# Patient Record
Sex: Male | Born: 1965 | Race: White | Hispanic: No | Marital: Married | Smoking: Never smoker
Health system: Southern US, Community
[De-identification: ages and names within clinical notes are randomized; demographics above are authoritative.]

## PROBLEM LIST (undated history)

## (undated) DIAGNOSIS — C801 Malignant (primary) neoplasm, unspecified: Secondary | ICD-10-CM

## (undated) HISTORY — PX: KIDNEY DONATION: SHX685

## (undated) HISTORY — PX: HERNIA REPAIR: SHX51

---

## 2015-08-29 ENCOUNTER — Ambulatory Visit (INDEPENDENT_AMBULATORY_CARE_PROVIDER_SITE_OTHER): Payer: Self-pay

## 2015-08-29 ENCOUNTER — Other Ambulatory Visit: Payer: Self-pay | Admitting: Adult Health

## 2015-08-29 DIAGNOSIS — M438X6 Other specified deforming dorsopathies, lumbar region: Secondary | ICD-10-CM

## 2015-08-29 DIAGNOSIS — R52 Pain, unspecified: Secondary | ICD-10-CM

## 2015-08-29 DIAGNOSIS — M545 Low back pain: Secondary | ICD-10-CM

## 2015-08-29 DIAGNOSIS — G8929 Other chronic pain: Secondary | ICD-10-CM

## 2016-01-21 ENCOUNTER — Emergency Department (INDEPENDENT_AMBULATORY_CARE_PROVIDER_SITE_OTHER)
Admission: EM | Admit: 2016-01-21 | Discharge: 2016-01-21 | Disposition: A | Discharge status: Home / Self Care | Payer: 59 | Source: Home / Self Care | Attending: Family Medicine | Admitting: Family Medicine

## 2016-01-21 ENCOUNTER — Encounter: Payer: Self-pay | Admitting: *Deleted

## 2016-01-21 DIAGNOSIS — R2241 Localized swelling, mass and lump, right lower limb: Secondary | ICD-10-CM

## 2016-01-21 DIAGNOSIS — K432 Incisional hernia without obstruction or gangrene: Secondary | ICD-10-CM

## 2016-01-21 NOTE — Discharge Instructions (Signed)
Followup with [facility] for evaluation of abdominal hernias.   Hernia, Adult A hernia is the bulging of an organ or tissue through a weak spot in the muscles of the abdomen (abdominal wall). Hernias develop most often near the navel or groin. There are many kinds of hernias. Common kinds include:  Femoral hernia. This kind of hernia develops under the groin in the upper thigh area.  Inguinal hernia. This kind of hernia develops in the groin or scrotum.  Umbilical hernia. This kind of hernia develops near the navel.  Hiatal hernia. This kind of hernia causes part of the stomach to be pushed up into the chest.  Incisional hernia. This kind of hernia bulges through a scar from an abdominal surgery. CAUSES This condition may be caused by:  Heavy lifting.  Coughing over a long period of time.  Straining to have a bowel movement.  An incision made during an abdominal surgery.  A birth defect (congenital defect).  Excess weight or obesity.  Smoking.  Poor nutrition.  Cystic fibrosis.  Excess fluid in the abdomen.  Undescended testicles. SYMPTOMS Symptoms of a hernia include:  A lump on the abdomen. This is the first sign of a hernia. The lump may become more obvious with standing, straining, or coughing. It may get bigger over time if it is not treated or if the condition causing it is not treated.  Pain. A hernia is usually painless, but it may become painful over time if treatment is delayed. The pain is usually dull and may get worse with standing or lifting heavy objects. Sometimes a hernia gets tightly squeezed in the weak spot (strangulated) or stuck there (incarcerated) and causes additional symptoms. These symptoms may include:  Vomiting.  Nausea.  Constipation.  Irritability. DIAGNOSIS A hernia may be diagnosed with:  A physical exam. During the exam your health care provider may ask you to cough or to make a specific movement, because a  hernia is usually more visible when you move.  Imaging tests. These can include:  X-rays.  Ultrasound.  CT scan. TREATMENT A hernia that is small and painless may not need to be treated. A hernia that is large or painful may be treated with surgery. Inguinal hernias may be treated with surgery to prevent incarceration or strangulation. Strangulated hernias are always treated with surgery, because lack of blood to the trapped organ or tissue can cause it to die. Surgery to treat a hernia involves pushing the bulge back into place and repairing the weak part of the abdomen. HOME CARE INSTRUCTIONS  Avoid straining.  Do not lift anything heavier than 10 lb (4.5 kg).  Lift with your leg muscles, not your back muscles. This helps avoid strain.  When coughing, try to cough gently.  Prevent constipation. Constipation leads to straining with bowel movements, which can make a hernia worse or cause a hernia repair to break down. You can prevent constipation by:  Eating a high-fiber diet that includes plenty of fruits and vegetables.  Drinking enough fluids to keep your urine clear or pale yellow. Aim to drink 6-8 glasses of water per day.  Using a stool softener as directed by your health care provider.  Lose weight, if you are overweight.  Do not use any tobacco products, including cigarettes, chewing tobacco, or electronic cigarettes. If you need help quitting, ask your health care provider.  Keep all follow-up visits as directed by your health care provider. This is important. Your health care provider may  need to monitor your condition. SEEK MEDICAL CARE IF:  You have swelling, redness, and pain in the affected area.  Your bowel habits change. SEEK IMMEDIATE MEDICAL CARE IF:  You have a fever.  You have abdominal pain that is getting worse.  You feel nauseous or you vomit.  You cannot push the hernia back in place by gently pressing on it while you are lying down.  The  hernia:  Changes in shape or size.  Is stuck outside the abdomen.  Becomes discolored.  Feels hard or tender.   This information is not intended to replace advice given to you by your health care provider. Make sure you discuss any questions you have with your health care provider.   Document Released: 08/03/2005 Document Revised: 08/24/2014 Document Reviewed: 06/13/2014 Elsevier Interactive Patient Education [employer].

## 2016-01-21 NOTE — ED Provider Notes (Signed)
CSN: [id number]     Arrival date & time 01/21/16  1743 History   First MD Initiated Contact with Patient 01/21/16 1837     Chief Complaint  Patient presents with  . Hernia      HPI Comments: Patient presents with two problems: 1)  He is concerned about a recurrent umbilical hernia.  He had umbilical hernia repair about 8 years ago which has recurred after significant weight gain.  He notes that the hernia is always reducible.  Several years ago he had laparascope insertion in his lower left abdomen, and he has now developed a reducible hernia at that site also. 2)  Patient has a large nontender mass above his right posterior knee.  About 7 years ago he fell off a deck, striking the area above his right popliteal area on a deck board.  He has developed a gradually swelling painless mass at that site that he believes is a lipoma.  The history is provided by the patient.    History reviewed. No pertinent past medical history. Past Surgical History  Procedure Laterality Date  . Kidney donation    . Hernia repair     History reviewed. No pertinent family history. Social History  Substance Use Topics  . Smoking status: Never Smoker   . Smokeless tobacco: None  . Alcohol Use: No    Review of Systems  Constitutional: Negative for fever, chills, diaphoresis, activity change, fatigue and unexpected weight change.  HENT: Negative.   Eyes: Negative.   Respiratory: Negative.   Cardiovascular: Negative.   Gastrointestinal: Negative for abdominal pain.  Genitourinary: Negative.   Musculoskeletal:       Mass in right posterior thigh above knee.  Skin: Negative.     Allergies  Review of patient's allergies indicates no known allergies.  Home Medications   Prior to Admission medications   Not on File   Meds Ordered and Administered this Visit  Medications - No data to display  BP 136/90 mmHg  Pulse 95  Temp(Src) 98.4 F (36.9 C) (Oral)  Resp 15  Ht 5\' 11"  (1.803 m)  Wt 280 lb  (127.007 kg)  BMI 39.07 kg/m2  SpO2 96% No data found.   Physical Exam  Constitutional: He appears well-developed and well-nourished. No distress.  Patient is obese (BMI 39.1)  HENT:  Head: Normocephalic.  Nose: Nose normal.  Mouth/Throat: Oropharynx is clear and moist.  Eyes: Conjunctivae are normal. Pupils are equal, round, and reactive to light.  Neck: Neck supple.  Cardiovascular: Normal heart sounds.   Pulmonary/Chest: Breath sounds normal.  Abdominal: Soft. Bowel sounds are normal. He exhibits mass. He exhibits no distension. There is no hepatomegaly. There is no tenderness. There is no tenderness at McBurney's point. A hernia is present.    Nontender reducible abdominal wall hernias as noted on diagram.    Musculoskeletal:       Right upper leg: He exhibits swelling. He exhibits no tenderness and no bony tenderness.       Legs: Right posterior/medial, thigh just proximal to the knee has a large nontender soft mass about 8cm diameter as noted on diagram and photo.  There is no erythema or warmth.  Right knee has full range of motion.    Lymphadenopathy:    He has no cervical adenopathy.  Nursing note and vitals reviewed.    ED Course  Procedures none    MDM   1. 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   Lesion appears  to be too cephalad to be a Baker's cyst.  2. Incisional hernia (two), without obstruction or gangrene    Followup with Dr. Aundria Mems for evaluation of mass right lower leg (consider ultrasound). Followup with [facility] for evaluation of abdominal hernias.    Kandra Nicolas, MD 01/22/16 [phone]

## 2016-01-21 NOTE — ED Notes (Signed)
Pt c/o lower abd hernia bilaterally x 4 years. He also c/o a lump on his RT leg.

## 2016-01-22 ENCOUNTER — Telehealth: Payer: Self-pay | Admitting: *Deleted

## 2016-01-22 NOTE — ED Notes (Unsigned)
Called LM per pt request with referral information. Appt scheduled with Dr T for 01/24/16 @ 11:30am(per Luellen Pucker) arrive at 11:15am. Also, scheduled an appt with [facility] 01/24/16 @ 2:15pm arrive at 2:00pm.

## 2016-01-23 ENCOUNTER — Institutional Professional Consult (permissible substitution): Payer: 59 | Admitting: Sports Medicine

## 2016-01-24 ENCOUNTER — Ambulatory Visit (INDEPENDENT_AMBULATORY_CARE_PROVIDER_SITE_OTHER): Payer: 59 | Admitting: Sports Medicine

## 2016-01-24 VITALS — Wt 270.0 lb

## 2016-01-24 DIAGNOSIS — R2241 Localized swelling, mass and lump, right lower limb: Secondary | ICD-10-CM

## 2016-01-24 NOTE — Progress Notes (Addendum)
   Subjective:    I'm seeing this patient as a consultation for:  Dr. Theone Murdoch  CC: Right thigh mass  HPI: His is a pleasant 50 year old male, he comes in with a long history of a mass on his right inner lower thigh, slowly enlarging, he does endorse some trauma in the distant past. No constitutional symptoms. He was seen in urgent care and referred to me for further evaluation and definitive treatment.  Past medical history, Surgical history, Family history not pertinant except as noted below, Social history, Allergies, and medications have been entered into the medical record, reviewed, and no changes needed.   Review of Systems: No headache, visual changes, nausea, vomiting, diarrhea, constipation, dizziness, abdominal pain, skin rash, fevers, chills, night sweats, weight loss, swollen lymph nodes, body aches, joint swelling, muscle aches, chest pain, shortness of breath, mood changes, visual or auditory hallucinations.   Objective:   General: Well Developed, well nourished, and in no acute distress.  Neuro/Psych: Alert and oriented x3, extra-ocular muscles intact, able to move all 4 extremities, sensation grossly intact. Skin: Warm and dry, no rashes noted.  Respiratory: Not using accessory muscles, speaking in full sentences, trachea midline.  Cardiovascular: Pulses palpable, no extremity edema. Abdomen: Does not appear distended. Right thigh: Large, protuberant, minimally tender mass along the medial distal thigh.  Procedure: Diagnostic Ultrasound of  Right thigh mass Device: GE Logiq E  Findings: 3 x 5 x 6 cm heterogenous right lower inner thigh mass, internal vascular flow. Images permanently stored and available for review in the ultrasound unit.  Impression:  3 X 5 x 6 cm heterogenous right lower inner thigh mass, subcutaneous, appears to surround the great saphenous vein.      Impression and Recommendations:   This case required medical decision making of moderate  complexity.

## 2016-01-24 NOTE — Assessment & Plan Note (Signed)
Large 3 x 5 x 6 cm heterogenousmass along the medial right thigh, ultrasound does show some internal venous flow consistent with the great saphenous vein. 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 Referral to general surgery for consideration of excision.

## 2016-01-25 LAB — BASIC METABOLIC PANEL WITH GFR
BUN: 17 mg/dL (ref 7–25)
CO2: 21 mmol/L (ref 20–31)
Calcium: 9.1 mg/dL (ref 8.6–10.3)
Chloride: 103 mmol/L (ref 98–110)
Creat: 0.91 mg/dL (ref 0.70–1.33)
Glucose, Bld: 83 mg/dL (ref 65–99)
Potassium: 4.3 mmol/L (ref 3.5–5.3)
Sodium: 137 mmol/L (ref 135–146)

## 2016-01-30 ENCOUNTER — Telehealth: Payer: Self-pay | Admitting: Sports Medicine

## 2016-01-30 DIAGNOSIS — R2241 Localized swelling, mass and lump, right lower limb: Secondary | ICD-10-CM

## 2016-01-30 NOTE — Assessment & Plan Note (Signed)
MRI done at [facility] shows features consistent with a liposarcoma, encircling the great saphenous vein. Referral to Dr. Barry Dienes with [facility] surgery, surgical oncology. We do need a tissue diagnosis, and subsequent medical oncology consultation as well. Patient will return to see me in one month a helping's are going.

## 2016-01-30 NOTE — Telephone Encounter (Signed)
MRI done at [facility] shows features consistent with a liposarcoma, encircling the great saphenous vein. Referral to Dr. Barry Dienes with [facility] surgery, surgical oncology. We do need a tissue diagnosis, and subsequent medical oncology consultation as well. Patient will return to see me in one month a helping's are going. Discussed these results with the patient on the phone.

## 2016-02-05 ENCOUNTER — Ambulatory Visit (HOSPITAL_BASED_OUTPATIENT_CLINIC_OR_DEPARTMENT_OTHER)
Admission: RE | Admit: 2016-02-05 | Discharge: 2016-02-05 | Disposition: A | Discharge status: Home / Self Care | Payer: 59 | Source: Ambulatory Visit | Attending: Hematology & Oncology | Admitting: Hematology & Oncology

## 2016-02-05 ENCOUNTER — Ambulatory Visit: Payer: 59

## 2016-02-05 ENCOUNTER — Other Ambulatory Visit (HOSPITAL_BASED_OUTPATIENT_CLINIC_OR_DEPARTMENT_OTHER): Payer: 59

## 2016-02-05 ENCOUNTER — Ambulatory Visit (HOSPITAL_BASED_OUTPATIENT_CLINIC_OR_DEPARTMENT_OTHER): Payer: 59 | Admitting: Hematology & Oncology

## 2016-02-05 ENCOUNTER — Encounter: Payer: Self-pay | Admitting: Hematology & Oncology

## 2016-02-05 VITALS — BP 114/72 | HR 103 | Temp 97.9°F | Resp 16 | Ht 71.0 in | Wt 276.0 lb

## 2016-02-05 DIAGNOSIS — R2241 Localized swelling, mass and lump, right lower limb: Secondary | ICD-10-CM | POA: Insufficient documentation

## 2016-02-05 LAB — COMPREHENSIVE METABOLIC PANEL (CC13)
A/G RATIO: 1.6 (ref 1.2–2.2)
ALBUMIN: 4.5 g/dL (ref 3.5–5.5)
ALT: 24 IU/L (ref 0–44)
AST (SGOT): 19 IU/L (ref 0–40)
Alkaline Phosphatase, S: 92 IU/L (ref 39–117)
BUN / CREAT RATIO: 17 (ref 9–20)
BUN: 17 mg/dL (ref 6–24)
Bilirubin Total: 0.3 mg/dL (ref 0.0–1.2)
CALCIUM: 9.5 mg/dL (ref 8.7–10.2)
CO2: 21 mmol/L (ref 18–29)
CREATININE: 1.01 mg/dL (ref 0.76–1.27)
Chloride, Ser: 104 mmol/L (ref 96–106)
GFR calc Af Amer: 99 mL/min/{1.73_m2} (ref >59)
GFR, EST NON AFRICAN AMERICAN: 86 mL/min/{1.73_m2} (ref >59)
GLOBULIN, TOTAL: 2.8 g/dL (ref 1.5–4.5)
Glucose: 110 mg/dL — ABNORMAL HIGH (ref 65–99)
POTASSIUM: 3.6 mmol/L (ref 3.5–5.2)
SODIUM: 135 mmol/L (ref 134–144)
Total Protein: 7.3 g/dL (ref 6.0–8.5)

## 2016-02-05 LAB — CBC WITH DIFFERENTIAL (CANCER CENTER ONLY)
BASO#: 0.1 10*3/uL (ref 0.0–0.2)
BASO%: 0.6 % (ref 0.0–2.0)
EOS%: 3.4 % (ref 0.0–7.0)
Eosinophils Absolute: 0.4 10*3/uL (ref 0.0–0.5)
HCT: 40.7 % (ref 38.7–49.9)
HGB: 14 g/dL (ref 13.0–17.1)
LYMPH#: 2.5 10*3/uL (ref 0.9–3.3)
LYMPH%: 23.7 % (ref 14.0–48.0)
MCH: 31.3 pg (ref 28.0–33.4)
MCHC: 34.4 g/dL (ref 32.0–35.9)
MCV: 91 fL (ref 82–98)
MONO#: 0.6 10*3/uL (ref 0.1–0.9)
MONO%: 5.8 % (ref 0.0–13.0)
NEUT#: 7 10*3/uL — ABNORMAL HIGH (ref 1.5–6.5)
NEUT%: 66.5 % (ref 40.0–80.0)
PLATELETS: 353 10*3/uL (ref 145–400)
RBC: 4.47 10*6/uL (ref 4.20–5.70)
RDW: 13.4 % (ref 11.1–15.7)
WBC: 10.5 10*3/uL — AB (ref 4.0–10.0)

## 2016-02-05 LAB — TECHNOLOGIST REVIEW CHCC SATELLITE

## 2016-02-05 NOTE — Progress Notes (Signed)
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 Other    New Patient  : I have a growth in my right leg.  HPI: Stephen Dennis is a very nice 50 year old white male. 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 I find this very interesting.  About 4 years ago, he has some trauma to his right leg. He noted there was some swelling. He was told that this was a lipoma. He was asymptomatic with this.  Recent, this growth seemed to increase. There was no pain. He noted more veins on his right leg.  He ultimately had a MRI done of the right thigh. This was done on June 13. This showed a 6.1 x 4.4 x 5.9 Center meter soft tissue lobulated mass. The superficial margin is within 1-2 mm of the skin surface. The deep margin is within 1-2 mm of the superficial fascia of the gracilis muscle. By the appearance on the scan, the radiologist felt this was suspicious for liposarcoma.  Concurrently, he had a CT scan of the abdomen. He is to have hernia surgery. The lung bases looked okay. The abdomen showed the hernia. There is no obvious metastatic malignancy.  He was kindly referred to the [facility] for an evaluation.  He feels well. Again is not having any right leg pain. He is still working without any difficulties. There's no change in bowel or bladder habits. He has had no fever. He has had no cough or shortness of breath. His wife says he has had a little bit of a cough.  He has had past surgery without any difficulty.  There is no real family history of malignancy.  He does not smoke. He does not drink.  Currently, his performance status is ECOG 0.    No past medical history on file.:  Past Surgical History  Procedure Laterality Date  . Kidney donation    . Hernia repair    :  No current outpatient prescriptions on file.:  :  No Known Allergies:  No family history on  file.:  Social History   Social History  . Marital Status: Married    Spouse Name: N/A  . Number of Children: N/A  . Years of Education: N/A   Occupational History  . Not on file.   Social History Main Topics  . Smoking status: Never Smoker   . Smokeless tobacco: Not on file  . Alcohol Use: No  . Drug Use: No  . Sexual Activity: Not on file   Other Topics Concern  . Not on file   Social History Narrative  :  Pertinent items are noted in HPI.  Exam: @IPVITALS @ Well-developed and well-nourished white male in no obvious distress. Vital/a temperature of 97.9. Pulse 103. Blood pressure 114/72. Weight is 276 pounds. And neck exam shows no ocular or oral lesions. There are no palpable cervical or supraclavicular lymph nodes. Lungs are clear bilaterally. Cardiac exam regular rate and rhythm with a normal S1 and S2. There are no murmurs, rubs or bruits. Abdomen is soft. He does have a abdominal wall hernia. This is reducible. There is no guarding or rebound tenderness. He has no palpable liver or spleen tip. Back exam shows no tenderness over the spine, ribs or hips. Extremities shows the mass in the lower inner medial right thigh. This is slightly fleshy. It is not mobile.  It is nontender. He has no swelling in the lower legs. He has good pulses in his distal extremities. Skin exam shows no rashes, ecchymoses or petechia.    Recent Labs  02/05/16 1458  WBC 10.5*  HGB 14.0  HCT 40.7  PLT 353    Recent Labs  02/05/16 1500  NA 135  K 3.6  CL 104  CO2 21  GLUCOSE 110*  BUN 17  CREATININE 1.01  CALCIUM 9.5    Blood smear review:  None  Pathology: None     Assessment and Plan:  Stephen Dennis is a 48 year old white male. He has had this slowly growing mass in the right thigh area and he had an MRI recently which the radiologist was concerned about a liposarcoma or other sarcoma.  I believe this clearly needs to be resected. With the concern of a sarcoma, an orthopedic  oncologist is necessary. The correct surgical plans need to be approached so that there is no inadvertent and spread of the tumor if it is malignant.  I did do a chest x-ray on him today. Although the final report is not out yet, I looked at the x-ray and I do not see any obvious metastatic disease.  Again, if this is a sarcoma, this needs to be resected. I would think that he probably would need adjuvant radiation therapy depending on the pathology and the margins.  Given that this has been growing pretty slowly, one would not have to think that this is high-grade. However, doorway to know this is with a biopsy.  I will call one of the orthopedic oncologist at [facility]. I think this and be the next step for him. They may want to get a CT scan of his chest. Even if he has metastatic disease, I still feel this mass needs be resected so that there is no symptoms that he would have with it.  I spent about an hour with he and his wife. They're both very nice. He has a very strong faith..  As far as getting him back to the office, this will really depend upon the results of surgery.

## 2016-02-06 ENCOUNTER — Telehealth: Payer: Self-pay | Admitting: *Deleted

## 2016-02-06 LAB — LACTATE DEHYDROGENASE: LDH: 266 U/L — ABNORMAL HIGH (ref 125–245)

## 2016-02-06 NOTE — Telephone Encounter (Signed)
Called patient to let him know that his appointment with Dr Janice Coffin at [facility].  Appointment is June 27th at 1040am.  To arrive 20 minutes early.  Advised patient to bring MRI disc of scan he had on his leg.  Phone number is [phone].  Dr. Mylo Red nurse is Christel Mormon.  Patient to go to [facility] on the fourth floor.  [address]Stephen Dennis [phone].  PAtient to park in C deck at the purple level.  Left patient message requested patient to call back.

## 2016-02-06 NOTE — Telephone Encounter (Signed)
Referral made to Dr. Janice Coffin orthopaedics.  Appt made for June 27 at 1040am.  At [place].

## 2016-02-06 NOTE — Telephone Encounter (Addendum)
Patient' aware of results.   ----- Message from Volanda Napoleon, MD sent at 02/06/2016  6:49 AM EDT ----- Call - CXR is ok!!!!  Laurey Arrow

## 2016-05-17 ENCOUNTER — Encounter: Payer: Self-pay | Admitting: Emergency Medicine

## 2016-05-17 ENCOUNTER — Emergency Department (INDEPENDENT_AMBULATORY_CARE_PROVIDER_SITE_OTHER)
Admission: EM | Admit: 2016-05-17 | Discharge: 2016-05-17 | Disposition: A | Discharge status: Home / Self Care | Payer: 59 | Source: Home / Self Care | Attending: Family Medicine | Admitting: Family Medicine

## 2016-05-17 DIAGNOSIS — R058 Other specified cough: Secondary | ICD-10-CM

## 2016-05-17 DIAGNOSIS — K649 Unspecified hemorrhoids: Secondary | ICD-10-CM

## 2016-05-17 DIAGNOSIS — R05 Cough: Secondary | ICD-10-CM

## 2016-05-17 HISTORY — DX: Malignant (primary) neoplasm, unspecified: C80.1

## 2016-05-17 MED ORDER — MONTELUKAST SODIUM 10 MG PO TABS: 10.0000 mg | ORAL_TABLET | Freq: Every day | ORAL | 0 refills | Status: AC

## 2016-05-17 MED ORDER — HYDROCORTISONE ACETATE 25 MG RE SUPP: 25.0000 mg | Freq: Two times a day (BID) | RECTAL | 0 refills | Status: AC

## 2016-05-17 NOTE — ED Triage Notes (Signed)
Pt c/o hemorrhoids x1 week. States they are bleeding and he is using OTC cream with no relief.

## 2016-05-17 NOTE — Discharge Instructions (Signed)
Recommend Colace stool softener.  Try Citrucel with plenty of fluids. May take Delsym Cough Suppressant at bedtime for nighttime cough.

## 2016-05-17 NOTE — ED Provider Notes (Signed)
Stephen Dennis CARE    CSN: [id number] Arrival date & time: 05/17/16  1238     History   Chief Complaint Chief Complaint  Patient presents with  . Hemorrhoids    HPI Stephen Dennis is a 50 y.o. male.   Patient presents with two complaints: 1)  He has one week history of swollen hemorrhoid with itching and occasional bleeding, but minimal pain.  He is most concerned about ensuring that the anal mass he feels is truly a hemorrhoid.  He notes that his stools tend to be hard, although denies constipation, and there have been no changes in his bowel movements.  He has been using an OTC hemorrhoid cream without relief. 2)  He complains of a long history (present for years) of a seasonal cough with wheezing, somewhat worse at night.  No chest pain or shortness of breath.  No fevers, chills, and sweats.  He feels well.  He notes that his hemorrhoids have become more prominent as the result of his cough.   The history is provided by the patient.    Past Medical History:  Diagnosis Date  . Cancer [facility])     Patient Active Problem List   Diagnosis Date Noted  . Mass of right thigh 01/24/2016    Past Surgical History:  Procedure Laterality Date  . HERNIA REPAIR    . KIDNEY DONATION         Home Medications    Prior to Admission medications   Medication Sig Start Date End Date Taking? Authorizing Provider  hydrocortisone (ANUSOL-HC) 25 MG suppository Place 1 suppository (25 mg total) rectally 2 (two) times daily. 05/17/16   Kandra Nicolas, MD  montelukast (SINGULAIR) 10 MG tablet Take 1 tablet (10 mg total) by mouth at bedtime. 05/17/16   Kandra Nicolas, MD    Family History History reviewed. No pertinent family history.  Social History Social History  Substance Use Topics  . Smoking status: Never Smoker  . Smokeless tobacco: Never Used  . Alcohol use No     Allergies   Review of patient's allergies indicates no known allergies.   Review of Systems Review of  Systems  Constitutional: Negative.   HENT: Negative.   Eyes: Negative.   Respiratory: Positive for cough and wheezing. Negative for chest tightness and shortness of breath.   Cardiovascular: Negative.   Gastrointestinal: Positive for anal bleeding and constipation. Negative for abdominal pain, blood in stool, nausea, rectal pain and vomiting.  Genitourinary: Negative.   Musculoskeletal: Negative.   Skin: Negative.   Neurological: Negative for headaches.  All other systems reviewed and are negative.    Physical Exam Triage Vital Signs ED Triage Vitals  Enc Vitals Group     BP 05/17/16 1317 132/85     Pulse Rate 05/17/16 1317 86     Resp --      Temp 05/17/16 1317 98 F (36.7 C)     Temp Source 05/17/16 1317 Oral     SpO2 05/17/16 1317 97 %     Weight 05/17/16 1317 270 lb (122.5 kg)     Height --      Head Circumference --      Peak Flow --      Pain Score 05/17/16 1319 0     Pain Loc --      Pain Edu? --      Excl. in [place]? --    No data found.   Updated Vital Signs BP 132/85 (BP Location:  Right Arm)   Pulse 86   Temp 98 F (36.7 C) (Oral)   Wt 270 lb (122.5 kg)   SpO2 97%   BMI 37.66 kg/m   Visual Acuity Right Eye Distance:   Left Eye Distance:   Bilateral Distance:    Right Eye Near:   Left Eye Near:    Bilateral Near:     Physical Exam  Constitutional: He appears well-developed and well-nourished.  HENT:  Head: Normocephalic.  Nose: Nose normal.  Mouth/Throat: Oropharynx is clear and moist.  Eyes: Conjunctivae are normal. Pupils are equal, round, and reactive to light.  Neck: Neck supple.  Cardiovascular: Normal heart sounds.   Pulmonary/Chest: Effort normal and breath sounds normal. He has no wheezes.  Abdominal: Soft. There is no tenderness.  Genitourinary: Rectal exam shows external hemorrhoid.     Genitourinary Comments: There is a right thrombosed hemorrhoid measuring about 1.5cm by 2cm as noted on diagram.  There is some superficial  excoriation but minimal tenderness to palpation.  Musculoskeletal: He exhibits no edema.  Lymphadenopathy:    He has no cervical adenopathy.  Skin: Skin is warm and dry. No rash noted.  Nursing note and vitals reviewed.    UC Treatments / Results  Labs (all labs ordered are listed, but only abnormal results are displayed) Labs Reviewed - No data to display  EKG  EKG Interpretation None       Radiology No results found.  Procedures Procedures (including critical care time)  Medications Ordered in UC Medications - No data to display   Initial Impression / Assessment and Plan / UC Course  I have reviewed the triage vital signs and the nursing notes.  Pertinent labs & imaging results that were available during my care of the patient were reviewed by me and considered in my medical decision making (see chart for details).  Clinical Course  Suspect that patient's cough may be allergy mediated, and his hemorrhoids are exacerbated by frequent cough and straining with bowel movements.  Begin trial of Singulair. Rx for Anusol HC suppositories. Recommend Colace stool softener.  Try Citrucel with plenty of fluids. May take Delsym Cough Suppressant at bedtime for nighttime cough.  Followup with Family Doctor.     Final Clinical Impressions(s) / UC Diagnoses   Final diagnoses:  Hemorrhoids, unspecified hemorrhoid type  Nocturnal cough    New Prescriptions New Prescriptions   HYDROCORTISONE (ANUSOL-HC) 25 MG SUPPOSITORY    Place 1 suppository (25 mg total) rectally 2 (two) times daily.   MONTELUKAST (SINGULAIR) 10 MG TABLET    Take 1 tablet (10 mg total) by mouth at bedtime.     Kandra Nicolas, MD 05/18/16 [phone]

## 2016-07-24 ENCOUNTER — Emergency Department (INDEPENDENT_AMBULATORY_CARE_PROVIDER_SITE_OTHER)
Admission: EM | Admit: 2016-07-24 | Discharge: 2016-07-24 | Disposition: A | Discharge status: Home / Self Care | Payer: 59 | Source: Home / Self Care | Attending: Family Medicine | Admitting: Family Medicine

## 2016-07-24 ENCOUNTER — Encounter: Payer: Self-pay | Admitting: *Deleted

## 2016-07-24 DIAGNOSIS — K460 Unspecified abdominal hernia with obstruction, without gangrene: Secondary | ICD-10-CM | POA: Diagnosis not present

## 2016-07-24 DIAGNOSIS — K436 Other and unspecified ventral hernia with obstruction, without gangrene: Secondary | ICD-10-CM

## 2016-07-24 NOTE — ED Notes (Signed)
Pt sent to [facility] for surgical consult.

## 2016-07-24 NOTE — ED Provider Notes (Signed)
[facility]    CSN: [id number] Arrival date & time: 07/24/16  1117     History   Chief Complaint Chief Complaint  Patient presents with  . Hernia    HPI Maxsen Golaszewski is a 50 y.o. male.   Patient has had a ventral hernia for about 3 years.  He reports that the hernia protrudes almost daily and he is able to reduce it without difficulty.  This morning he awoke with midline abdominal pain, and was not able to reduce his hernia.  He has had mild nausea without vomiting.  No recent fever.  Bowel movements have been normal. In June 2017, he had been evaluated by surgeon Randolm Idol, MD who recommended operative repair with placement of mesh.  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   The history is provided by the patient.    Past Medical History:  Diagnosis Date  . Cancer [facility])     Patient Active Problem List   Diagnosis Date Noted  . Mass of right thigh 01/24/2016    Past Surgical History:  Procedure Laterality Date  . HERNIA REPAIR    . KIDNEY DONATION         Home Medications    Prior to Admission medications   Medication Sig Start Date End Date Taking? Authorizing Provider  hydrocortisone (ANUSOL-HC) 25 MG suppository Place 1 suppository (25 mg total) rectally 2 (two) times daily. 05/17/16   Kandra Nicolas, MD  montelukast (SINGULAIR) 10 MG tablet Take 1 tablet (10 mg total) by mouth at bedtime. 05/17/16   Kandra Nicolas, MD    Family History History reviewed. No pertinent family history.  Social History Social History  Substance Use Topics  . Smoking status: Never Smoker  . Smokeless tobacco: Never Used  . Alcohol use No     Allergies   Patient has no known allergies.   Review of Systems Review of Systems  Constitutional: Positive for appetite change and chills. Negative for diaphoresis, fatigue and fever.  HENT: Negative.   Eyes: Negative.   Respiratory:  Negative.   Cardiovascular: Negative.   Gastrointestinal: Positive for abdominal distention, abdominal pain and nausea. Negative for anal bleeding, blood in stool, constipation, diarrhea and vomiting.  Genitourinary: Negative.   Musculoskeletal: Negative.   Skin: Negative.   Neurological: Negative for headaches.     Physical Exam Triage Vital Signs ED Triage Vitals  Enc Vitals Group     BP 07/24/16 1134 142/91     Pulse Rate 07/24/16 1134 88     Resp 07/24/16 1134 18     Temp 07/24/16 1134 98.4 F (36.9 C)     Temp Source 07/24/16 1134 Oral     SpO2 07/24/16 1134 98 %     Weight 07/24/16 1134 278 lb (126.1 kg)     Height --      Head Circumference --      Peak Flow --      Pain Score 07/24/16 1136 6     Pain Loc --      Pain Edu? --      Excl. in [place]? --    No data found.   Updated Vital Signs BP 142/91 (BP Location: Left Arm)   Pulse 88   Temp 98.4 F (36.9 C) (Oral)   Resp 18   Wt 278 lb (126.1 kg)   SpO2 98%   BMI 38.77 kg/m  Visual Acuity Right Eye Distance:   Left Eye Distance:   Bilateral Distance:    Right Eye Near:   Left Eye Near:    Bilateral Near:     Physical Exam  Constitutional: He appears well-developed and well-nourished. No distress.  HENT:  Head: Normocephalic.  Nose: Nose normal.  Mouth/Throat: Oropharynx is clear and moist.  Eyes: Conjunctivae are normal. Pupils are equal, round, and reactive to light.  Neck: Neck supple.  Cardiovascular: Normal rate, regular rhythm and normal heart sounds.   Pulmonary/Chest: Breath sounds normal.  Abdominal: Soft. Bowel sounds are normal. He exhibits mass. There is tenderness in the periumbilical area.    Large, firm tender midline hernia palpated, not reducible.  Musculoskeletal: He exhibits no edema.  Neurological: He is alert.  Nursing note and vitals reviewed.    UC Treatments / Results  Labs (all labs ordered are listed, but only abnormal results are displayed) Labs Reviewed - No  data to display  EKG  EKG Interpretation None       Radiology No results found.  Procedures Procedures (including critical care time)  Medications Ordered in UC Medications - No data to display   Initial Impression / Assessment and Plan / UC Course  I have reviewed the triage vital signs and the nursing notes.  Pertinent labs & imaging results that were available during my care of the patient were reviewed by me and considered in my medical decision making (see chart for details).  Clinical Course   [facility] who advised that patient proceed to [facility] ED for surgical evaluation. Patient's vital signs are stable and he is safe to proceed in private vehicle     Final Clinical Impressions(s) / UC Diagnoses   Final diagnoses:  Incarcerated ventral hernia    New Prescriptions Discharge Medication List as of 07/24/2016  1:22 PM       Kandra Nicolas, MD 07/24/16 1357

## 2016-07-24 NOTE — ED Triage Notes (Signed)
Patient has umbilical hernia present for 3 years. He reports typically its bulging out but this AM it was in and painful. It is currently pushed out and still very painful. Nausea at times.

## 2017-10-26 IMAGING — DX DG CHEST 2V
2 series · 2 of 2 positions shown · non-contrast
Comparison: None.

CLINICAL DATA: Sarcoma.  Mass on right thigh.

EXAM:
CHEST  2 VIEW

[chest pa]
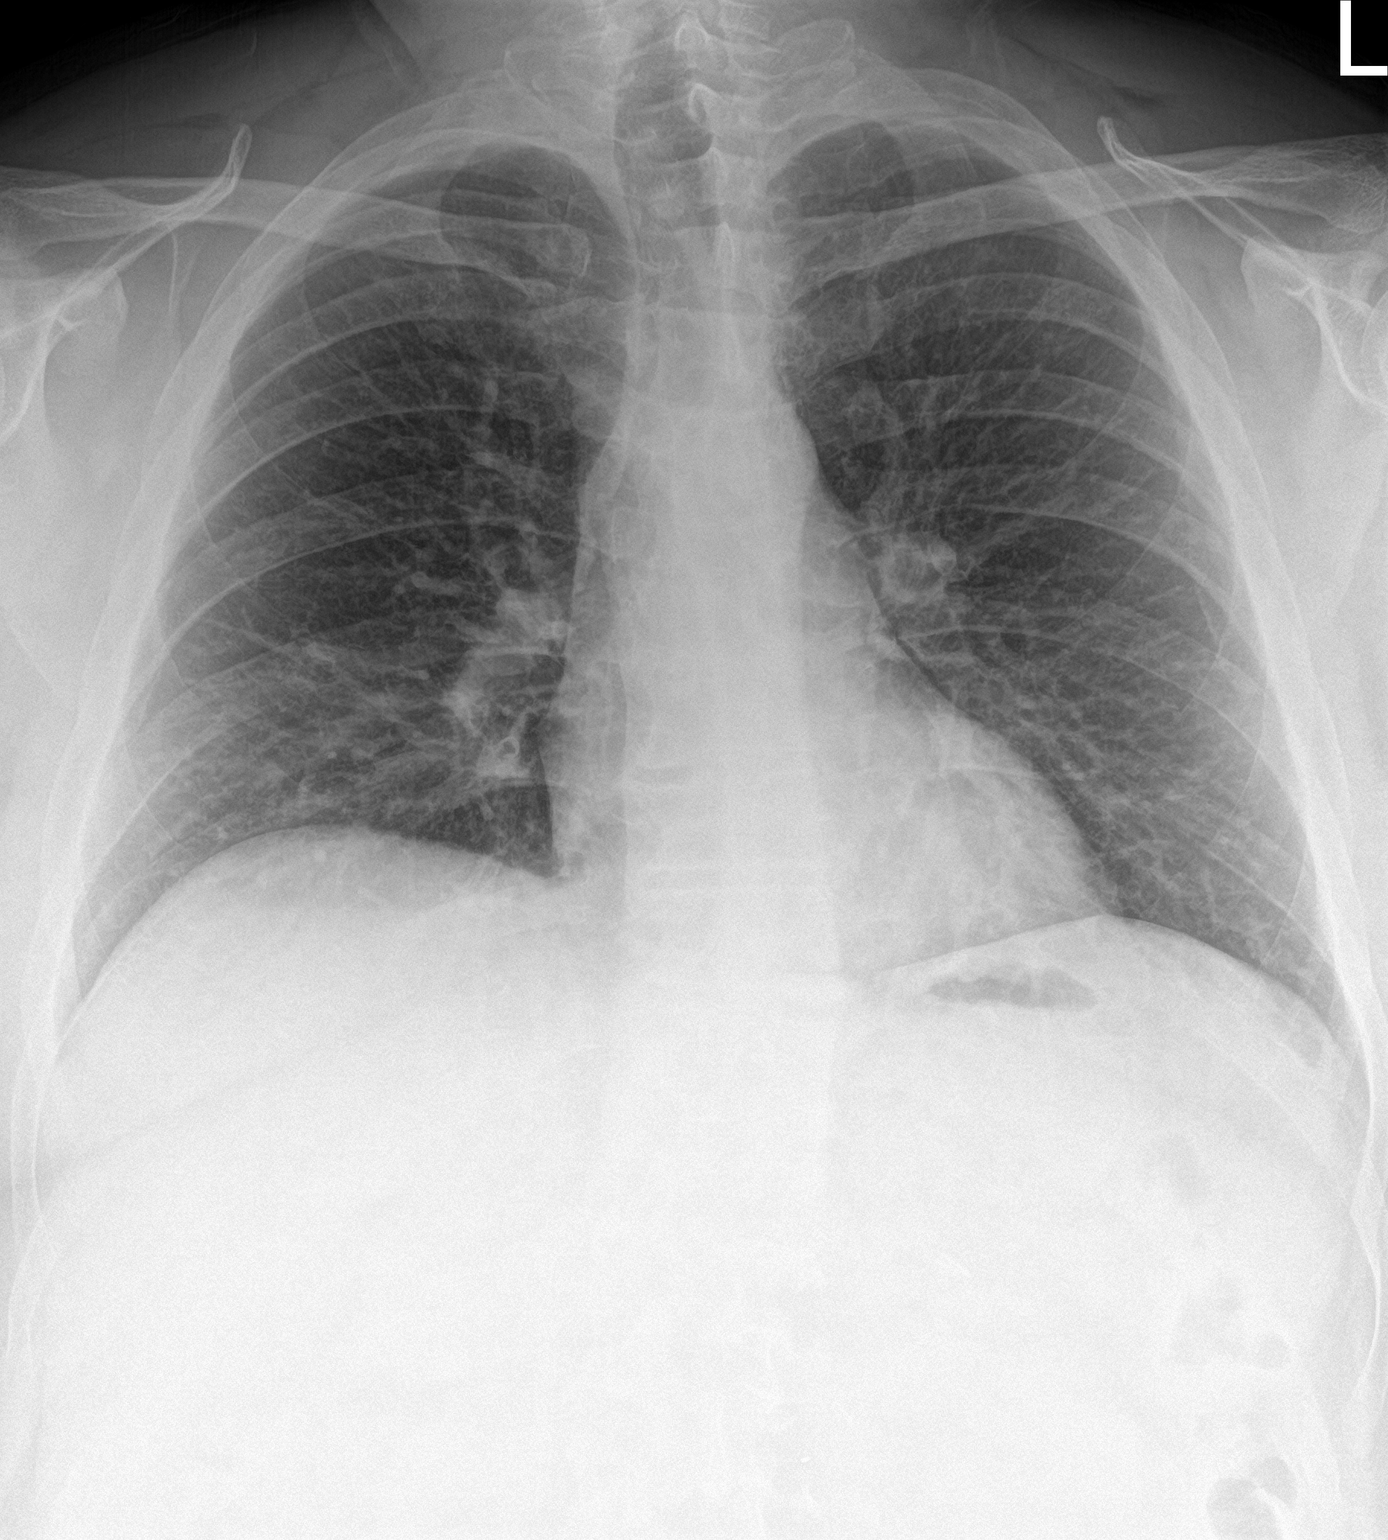

[chest lat]
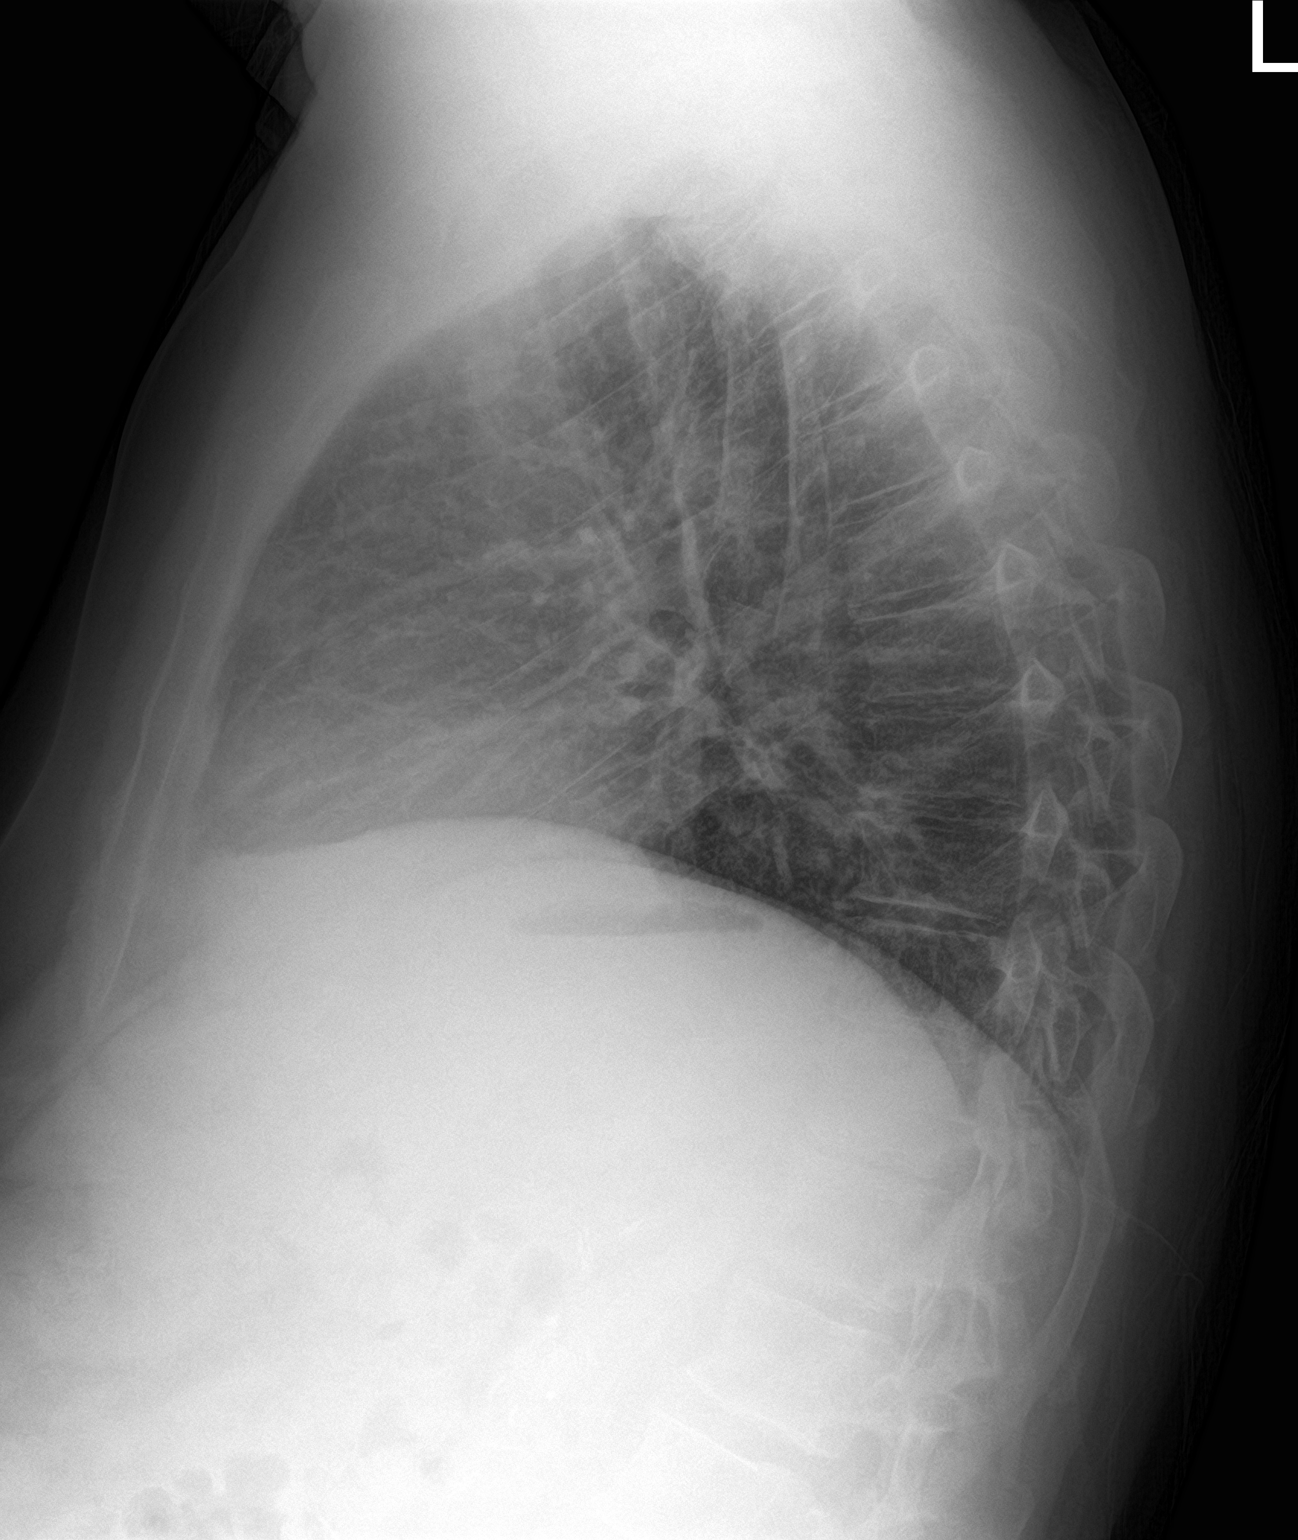

[2 of 2 positions shown; findings below may reference images not displayed]

FINDINGS: Mild lower thoracic spondylosis. Midline trachea. Normal heart size
and mediastinal contours. No pleural effusion or pneumothorax.
Apparent mild basilar predominant pulmonary interstitial thickening
is favored to be due to overlying soft tissues and patient body
habitus. No pulmonary nodule or mass. No lobar consolidation.
IMPRESSION: No evidence of pulmonary metastasis.
